# Patient Record
Sex: Male | Born: 1985 | Race: White | Hispanic: No | Marital: Single | Smoking: Never smoker
Health system: Southern US, Community
[De-identification: ages and names within clinical notes are randomized; demographics above are authoritative.]

## PROBLEM LIST (undated history)

## (undated) DIAGNOSIS — B1920 Unspecified viral hepatitis C without hepatic coma: Secondary | ICD-10-CM

## (undated) HISTORY — PX: APPENDECTOMY: SHX54

---

## 2007-10-21 ENCOUNTER — Other Ambulatory Visit: Payer: Self-pay

## 2007-10-21 ENCOUNTER — Emergency Department: Payer: Self-pay | Admitting: Emergency Medicine

## 2007-11-07 ENCOUNTER — Emergency Department: Payer: Self-pay | Admitting: Emergency Medicine

## 2007-11-11 ENCOUNTER — Emergency Department: Payer: Self-pay | Admitting: Emergency Medicine

## 2008-07-25 ENCOUNTER — Emergency Department: Payer: Self-pay | Admitting: Emergency Medicine

## 2008-11-04 ENCOUNTER — Emergency Department: Payer: Self-pay | Admitting: Emergency Medicine

## 2008-11-07 ENCOUNTER — Emergency Department (HOSPITAL_COMMUNITY): Admission: EM | Admit: 2008-11-07 | Discharge: 2008-11-07 | Payer: Self-pay | Admitting: Emergency Medicine

## 2009-08-18 ENCOUNTER — Emergency Department: Payer: Self-pay | Admitting: Emergency Medicine

## 2009-10-16 ENCOUNTER — Emergency Department: Payer: Self-pay | Admitting: Emergency Medicine

## 2010-05-08 IMAGING — CR DG CHEST 2V
1 series · 2 of 2 positions shown · non-contrast
Comparison: none

REASON FOR EXAM: cough , chills
COMMENTS:

PROCEDURE:     DXR - DXR CHEST PA (OR AP) AND LATERAL  - August 18, 2009  [DATE]
RESULT:     Comparison is made to study 04 November, 2008.
The lungs are adequately inflated. There is no focal infiltrate. The cardiac
silhouette is normal in size. The pulmonary vascularity is not engorged.

[Series 1: view not recorded · 0.17mm/px · 2 of 2 slices shown]
[im 1/2]
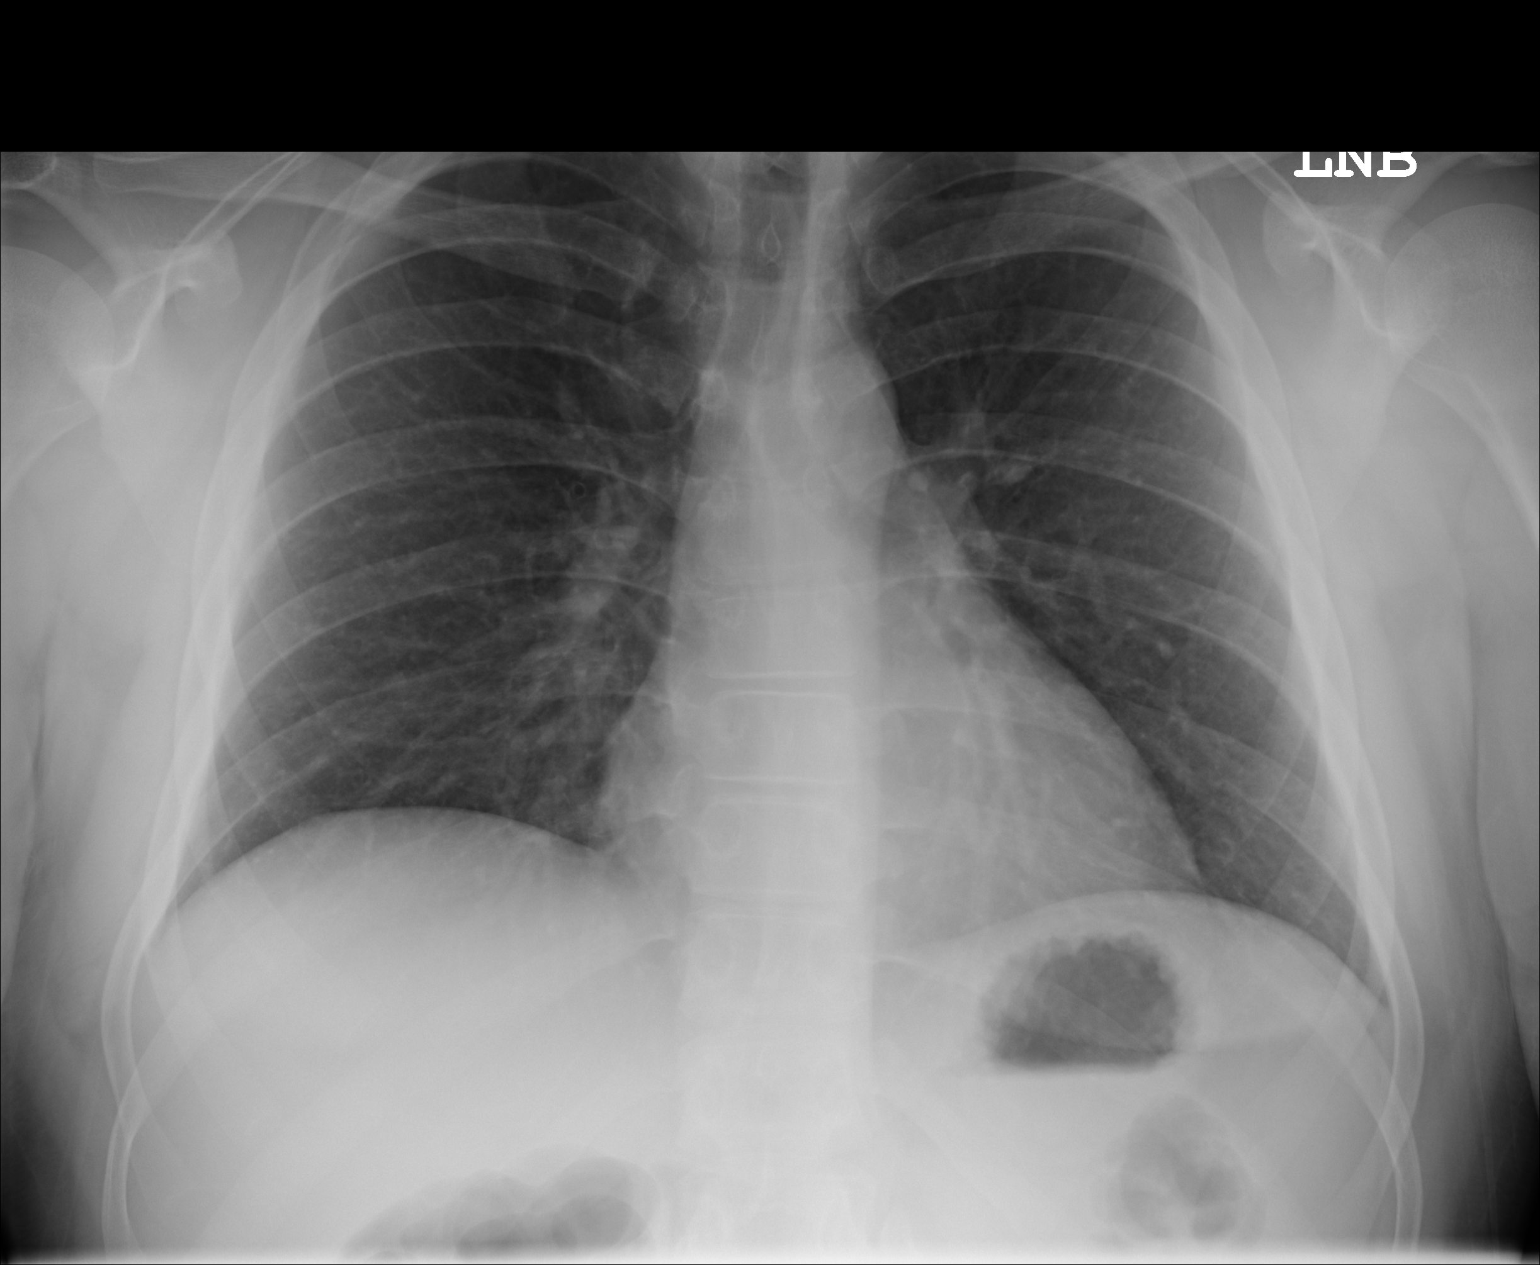
[im 2/2]
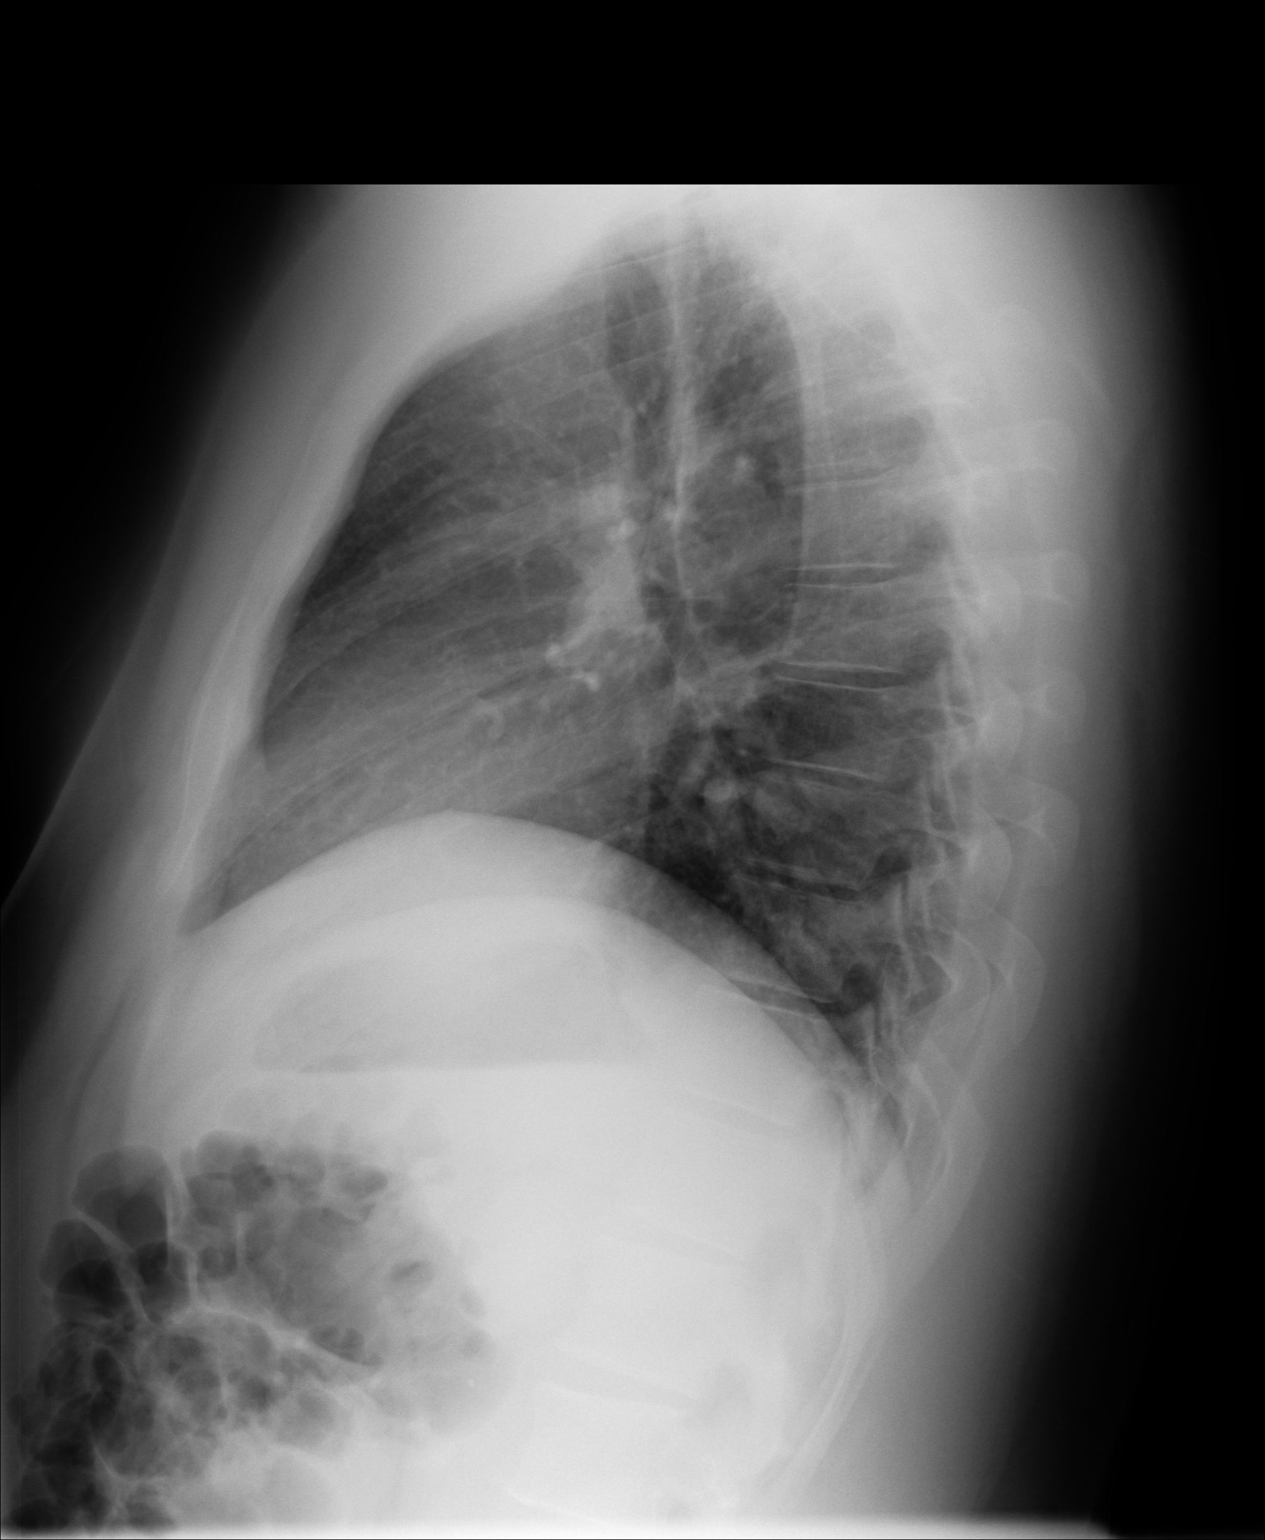

[2 of 2 positions shown; findings below may reference images not displayed]

IMPRESSION: I do do not see evidence of pneumonia nor definite evidence
of other acute cardiopulmonary abnormality.

## 2016-08-22 ENCOUNTER — Ambulatory Visit (INDEPENDENT_AMBULATORY_CARE_PROVIDER_SITE_OTHER): Payer: Self-pay | Admitting: Internal Medicine

## 2020-07-11 ENCOUNTER — Emergency Department: Payer: 59

## 2020-07-11 ENCOUNTER — Emergency Department
Admission: EM | Admit: 2020-07-11 | Discharge: 2020-07-11 | Disposition: A | Discharge status: Home / Self Care | Payer: 59 | Attending: Emergency Medicine | Admitting: Emergency Medicine

## 2020-07-11 DIAGNOSIS — M79662 Pain in left lower leg: Secondary | ICD-10-CM | POA: Insufficient documentation

## 2020-07-11 DIAGNOSIS — R079 Chest pain, unspecified: Secondary | ICD-10-CM | POA: Insufficient documentation

## 2020-07-11 HISTORY — DX: Unspecified viral hepatitis C without hepatic coma: B19.20

## 2020-07-11 LAB — CBC AND DIFFERENTIAL
Absolute NRBC: 0 10*3/uL (ref 0.00–0.00)
Basophils Absolute Automated: 0.06 10*3/uL (ref 0.00–0.08)
Basophils Automated: 1 %
Eosinophils Absolute Automated: 0.07 10*3/uL (ref 0.00–0.44)
Eosinophils Automated: 1.2 %
Hematocrit: 45.7 % (ref 37.6–49.6)
Hgb: 16.1 g/dL (ref 12.5–17.1)
Immature Granulocytes Absolute: 0.02 10*3/uL (ref 0.00–0.07)
Immature Granulocytes: 0.3 %
Lymphocytes Absolute Automated: 1.62 10*3/uL (ref 0.42–3.22)
Lymphocytes Automated: 27 %
MCH: 30 pg (ref 25.1–33.5)
MCHC: 35.2 g/dL (ref 31.5–35.8)
MCV: 85.1 fL (ref 78.0–96.0)
MPV: 9.8 fL (ref 8.9–12.5)
Monocytes Absolute Automated: 0.49 10*3/uL (ref 0.21–0.85)
Monocytes: 8.2 %
Neutrophils Absolute: 3.73 10*3/uL (ref 1.10–6.33)
Neutrophils: 62.3 %
Nucleated RBC: 0 /100 WBC (ref 0.0–0.0)
Platelets: 247 10*3/uL (ref 142–346)
RBC: 5.37 10*6/uL (ref 4.20–5.90)
RDW: 13 % (ref 11–15)
WBC: 5.99 10*3/uL (ref 3.10–9.50)

## 2020-07-11 LAB — BASIC METABOLIC PANEL
Anion Gap: 10 (ref 5.0–15.0)
BUN: 16 mg/dL (ref 9–28)
CO2: 24 mEq/L (ref 22–29)
Calcium: 10.2 mg/dL (ref 8.5–10.5)
Chloride: 103 mEq/L (ref 100–111)
Creatinine: 1 mg/dL (ref 0.7–1.3)
Glucose: 93 mg/dL (ref 70–100)
Potassium: 4.2 mEq/L (ref 3.5–5.1)
Sodium: 137 mEq/L (ref 136–145)

## 2020-07-11 LAB — GFR: EGFR: >60

## 2020-07-11 LAB — IHS D-DIMER: D-Dimer: <0.22 ug/mL FEU (ref 0.00–0.60)

## 2020-07-11 LAB — TROPONIN I: Troponin I: <0.01 ng/mL (ref 0.00–0.05)

## 2020-07-11 NOTE — Discharge Instructions (Signed)
Dear Garrett Barrett,    You were seen today by Deborah Chalk, PA-C. Thank you for choosing the [facility] for your healthcare needs.  We hope your visit today was EXCELLENT.    Follow up with your doctor this week for re-evaluation.  Return to the emergency department for any new or worsening symptoms.    If you have any questions or concerns, I am available at [phone]. Please do not hesitate to contact me if I can be of assistance.     Below is some information and resources that our patients often find helpful.    Sincerely,    Deborah Chalk, Physician Assistant  [facility] - Department of Emergency Medicine    ________________________________________________________________      Thank you for choosing [facility] for your emergency care needs.  We strive to provide EXCELLENT care to you and your family.      DOCTOR REFERRALS  Call [phone] if you need any further referrals and we can help you find a primary care doctor or specialist.  Also, available online at:  [email or web address]    YOUR CONTACT INFORMATION  Before leaving please check with registration to make sure we have an up-to-date contact number.  You can call registration at [phone] to update your information.  For questions about your hospital bill, please call [phone].  For questions about your Emergency Dept Physician bill please call [phone].      FREE HEALTH SERVICES  If you need help with health or social services, please call 2-1-1 for a free referral to resources in your area.  2-1-1 is a free service connecting people with information on health insurance, free clinics, pregnancy, mental health, dental care, food assistance, housing, and substance abuse counseling.  Also, available online at:  [email or web address].org    MEDICAL RECORDS AND TESTS  Certain laboratory test results do not come back the same day, for example urine  cultures.  We will contact you if other important findings are noted.  Radiology films are often reviewed again to ensure accuracy.  If there is any discrepancy, we will notify you.      Please call [phone] to pick up a complimentary CD of any radiology studies performed.  If you or your doctor would like to request a copy of your medical records, please call [phone].      ORTHOPEDIC INJURY   Please know that significant injuries can exist even when an initial x-ray is read as normal or negative.  This can occur because some fractures (broken bones) are not initially visible on x-rays.  For this reason, close outpatient follow-up with your primary care doctor or bone specialist (orthopedist) is required.    MEDICATIONS AND FOLLOWUP  Please be aware that some prescription medications can cause drowsiness.  Use caution when driving or operating machinery.    The examination and treatment you have received in our Emergency Department is provided on an emergency basis, and is not intended to be a substitute for your primary care physician.  It is important that your doctor checks you again and that you report any new or remaining problems at that time.      LOCAL PHARMACIES  [facility] - [address], [place], [place] [address] (1.4 miles, 7 minutes)  [facility] - [address] [place], [place] [address] (6.5 miles, 13 minutes)  Handout with directions available on request    PATIENT RELATIONS  If  you have any concerns, issues, or feedback related to your care, positive or negative, please do not hesitate to contact Patient Relations at [phone]. They are open from 8:30AM-5:00PM Monday through Friday.

## 2020-07-11 NOTE — ED Triage Notes (Signed)
Pt reports pain in L calf started on Thursday, feeling different. No redness or swelling noted. Reports Chest pain started today, with SOB, talking in full clear sentences. Bilateral pulses 3+. Is on estrogen medications.

## 2020-07-11 NOTE — ED Provider Notes (Signed)
[facility]       [facility]  EMERGENCY DEPARTMENT HISTORY AND PHYSICAL EXAM    Patient Information     Patient Name: Garrett Barrett, Garrett Barrett  Encounter Date:  07/11/2020  Patient DOB:  19-Apr-1986  MRN:  [id number]  Room:  TranOut/TO  Rendering Provider: Delice Bison A. Jeannetta Nap, PA-C, CAQ-EM    History of Presenting Illness     Chief Complaint: calf pain  Historian: pt  Onset: gradual, Thursday afternoon  Quality: muscle spasm  Location: left    Duration: x2 days      HPI Comments:   34 y.o. patient (transitioning M to F) h/o hep C c/o gradual onset of left calf pain that feels x2 days. Today around 1230pm pt states she started having 4/10 mid sternal chest pain and shortness of breath. No fever, cough, hemoptysis, abd pain, n/v, leg swelling. No radiation of the pain to the neck/jaw/arm. No recent travel or surgery. Non-smoker. Does take estrogen. No hx of DVT/PE. No other complaints.    PMD: Pcp, None, MD    Past Medical History     Past Medical History:   Diagnosis Date    Hepatitis C        Past Surgical History     Past Surgical History:   Procedure Laterality Date    APPENDECTOMY         Family History     History reviewed. No pertinent family history.    Social History     Social History     Socioeconomic History    Marital status: Single     Spouse name: Not on file    Number of children: Not on file    Years of education: Not on file    Highest education level: Not on file   Occupational History    Not on file   Tobacco Use    Smoking status: Never Smoker    Smokeless tobacco: Never Used   Vaping Use    Vaping Use: Never used   Substance and Sexual Activity    Alcohol use: Never    Drug use: Never    Sexual activity: Not on file   Other Topics Concern    Not on file   Social History Narrative    Not on file     Social Determinants of Health     Financial Resource Strain:     Difficulty of Paying Living Expenses: Not on file   Food Insecurity:     Worried About  Running Out of Food in the Last Year: Not on file    Ran Out of Food in the Last Year: Not on file   Transportation Needs:     Lack of Transportation (Medical): Not on file    Lack of Transportation (Non-Medical): Not on file   Physical Activity:     Days of Exercise per Week: Not on file    Minutes of Exercise per Session: Not on file   Stress:     Feeling of Stress : Not on file   Social Connections:     Frequency of Communication with Friends and Family: Not on file    Frequency of Social Gatherings with Friends and Family: Not on file    Attends Religious Services: Not on file    Active Member of Clubs or Organizations: Not on file    Attends Banker Meetings: Not on file    Marital Status: Not on file   Intimate  Partner Violence:     Fear of Current or Ex-Partner: Not on file    Emotionally Abused: Not on file    Physically Abused: Not on file    Sexually Abused: Not on file   Housing Stability:     Unable to Pay for Housing in the Last Year: Not on file    Number of Places Lived in the Last Year: Not on file    Unstable Housing in the Last Year: Not on file       Allergies     No Known Allergies    Home Medications     Prior to Admission medications    Medication Sig Start Date End Date Taking? Authorizing Provider   estradiol (VIVELLE-DOT) 0.05 MG/24HR  04/21/20   [provider]   spironolactone (ALDACTONE) 50 MG tablet  04/21/20   [provider]         Review of Systems     Review of Systems   Constitutional: Negative for fever.   Cardiovascular: Positive for chest pain.   Respiratory: Positive for shortness of breath. Negative for cough and hemoptysis.    Gastrointestinal: Negative for nausea, vomiting, and abd pain.  Musculoskeletal: Positive for left calf pain. No LE edema.    All other systems reviewed and are negative.      Physical Exam     Patient Vitals for the past 24 hrs:   BP Temp Temp src Pulse Resp SpO2 Height Weight   07/11/20 1635 127/80 99 F  (37.2 C) Oral 86 16 99 %     07/11/20 1627   Oral    6\' 1"  (1.854 m) 106.9 kg     Physical Exam   Nursing note and vitals reviewed.   Constitutional: Pt is oriented to person, place, and time. Pt appears well-developed and well-nourished. Comfortable. No distress.   HENT:     Head: Normocephalic and atraumatic.     Right Ear: External ear normal.     Left Ear: External ear normal.     Mouth/Throat: Normal speech. (Wearing a mask).   Eyes: Conjunctivae normal. No drainage. EOMI.  Neck: Normal range of motion.   Cardiovascular: Regular rate and rhythm. No murmur.    Pulmonary/Chest: Vesicular breath sounds. No respiratory distress. O2 sat 99% on RA.   Abdominal: Scar noted in RLQ which pt reports is from prior appendectomy. Bowel sounds are normal.   MSK: No LE edema. +Left calf TTP. 2+ DP/PT pulses BLE.  Neurological: Pt is alert and oriented to person, place, and time. GCS 15.  Skin: Skin is warm and dry. Normal skin turgor.   Psychiatric: Normal mood and affect. Behavior is normal.         Orders Placed During This Encounter     Orders Placed This Encounter   Procedures    Chest AP Portable    US Venous Dopp Left Low Extrem    Basic Metabolic Panel    CBC and differential    Troponin I    D-Dimer    GFR    Cardiac monitoring (ED ONLY)    ECG 12 Lead    ECG 12 lead    Saline lock IV       ED Medications Administered     ED Medication Orders (From admission, onward)    None          Diagnostic Study Results and Data Review     The results of the diagnostic studies below were  reviewed by the ED provider:    Labs  Results     Procedure Component Value Units Date/Time    Troponin I [[id number]] Collected: 07/11/20 1745    Specimen: Blood Updated: 07/11/20 1820     Troponin I <0.01 ng/mL     Basic Metabolic Panel [[id number]] Collected: 07/11/20 1745    Specimen: Blood Updated: 07/11/20 1812     Glucose 93 mg/dL      BUN 16 mg/dL      Creatinine 1.0 mg/dL      Calcium 21.3 mg/dL      Sodium 086 mEq/L       Potassium 4.2 mEq/L      Chloride 103 mEq/L      CO2 24 mEq/L      Anion Gap 10.0    GFR [[id number]] Collected: 07/11/20 1745     Updated: 07/11/20 1812     EGFR >60.0    D-Dimer [[id number]] Collected: 07/11/20 1745     Updated: 07/11/20 1811     D-Dimer <0.22 ug/mL FEU     CBC and differential [[id number]] Collected: 07/11/20 1745    Specimen: Blood Updated: 07/11/20 1754     WBC 5.99 x10 3/uL      Hgb 16.1 g/dL      Hematocrit 64.4 %      Platelets 247 x10 3/uL      RBC 5.37 x10 6/uL      MCV 85.1 fL      MCH 30.0 pg      MCHC 35.2 g/dL      RDW 13 %      MPV 9.8 fL      Neutrophils 62.3 %      Lymphocytes Automated 27.0 %      Monocytes 8.2 %      Eosinophils Automated 1.2 %      Basophils Automated 1.0 %      Immature Granulocytes 0.3 %      Nucleated RBC 0.0 /100 WBC      Neutrophils Absolute 3.73 x10 3/uL      Lymphocytes Absolute Automated 1.62 x10 3/uL      Monocytes Absolute Automated 0.49 x10 3/uL      Eosinophils Absolute Automated 0.07 x10 3/uL      Basophils Absolute Automated 0.06 x10 3/uL      Immature Granulocytes Absolute 0.02 x10 3/uL      Absolute NRBC 0.00 x10 3/uL           Radiologic Studies  Radiology Results (24 Hour)     Procedure Component Value Units Date/Time    US Venous Dopp Left Low Extrem [[id number]] Collected: 07/11/20 1850    Order Status: Completed Updated: 07/11/20 1853    Narrative:      HISTORY:  Left leg pain    COMPARISON:  None    FINDINGS: Duplex evaluation of the deep venous system of the left lower  extremity was performed.   The common femoral vein, femoral vein and  popliteal vein were visualized and appear unremarkable.  These veins are  compressible with no evidence of thrombus.  Doppler demonstrates patency  with normal directional and phasic flow.  Appropriate augmentation was  observed.  The proximal deep femoral vein is visualized and appears  clear.  The portions of the posterior tibial and peroneal veins that  were seen compress normally with no thrombus noted.         Impression:       No evidence of  deep venous thrombosis involving the left  lower extremity.    Gerlene Burdock, MD   07/11/2020 6:51 PM    Chest AP Portable [[id number]] Collected: 07/11/20 1822    Order Status: Completed Updated: 07/11/20 1832    Narrative:      CLINICAL HISTORY: Chest pain    COMPARISON: None    TECHNIQUE: Single portable semiupright AP chest    FINDINGS:    The lungs are clear. The heart size is normal. Mediastinal contours are  unremarkable.      Impression:        1. No acute cardiopulmonary process.    Launa Flight, MD   07/11/2020 6:30 PM            Abnormal results/incidental findings discussed with pt and/or family: n/a     Monitors, EKG, Procedures, Critical Care, and Splints     Cardiac Monitor Interpretation: Normal sinus rhythm at 76 bpm. Normal axis. Non-specific T wave inversions in lead III. No prior EKG for comparison. EKG reviewed w/ MD Arvilla Market.   EKG: Sinus rhythm in the low 80's. No ectopy.     MDM and Clinical Notes     Working Differential (not completely inclusive): DVT/PE, muscle spasm, ACS/MI, arrhythmia, pericarditis, etc    Nursing records reviewed and agree: Yes     Clinical Notes: History as above. Physical exam unremarkable aside from some mild L calf TTP. Will plan to check basic labs, cardiac marker, ddimer, ekg and cxr. Pt in agreement.    White count normal  H/H wnl  Platelets normal  Cr wnl  Trop neg x1  Ddimer neg     CXR wnl  Doppler neg for DVT    Re-Eval: Re-eval prior to discharge - Pt comfortable. No distress. VSS.    All results r/w pt. Stable for discharge. Workup overall reassuring. No evidence of DVT/PE. Encouraged f/u with PMD. If symptoms persist, could consider repeat doppler after a week or so. Pt in agreement. Questions answered. CP precautions reviewed as well as reasons for return.    Personal Protective Equipment:  I was wearing appropriate personal protective equipment at the time of patient evaluation and for all face-to-face interactions:      Pandemic Caveat:  The patient was seen and evaluated during the time of COVID pandemic.  Significant limitations can be present in the evaluation and management of emergency department patients during pandemic conditions, including but not limited to lack of testing, rapidly changing IHS protocols, and/or limited follow-up resources.         Prescriptions       Discharge Medication List as of 07/11/2020  7:18 PM          Diagnosis and Disposition     Clinical Impression:  1. Chest pain, unspecified type    2. Pain of left calf      Final diagnoses:   Chest pain, unspecified type   Pain of left calf       Disposition:  ED Disposition     ED Disposition Condition Date/Time Comment    Discharge  Sat Jul 11, 2020  7:35 PM [place] discharge to home/self care.    Condition at disposition: Stable                Rendering Provider: Cyndy Freeze. Jeannetta Nap, PA-C, CAQ-EM    Attending's signature signifies review of the provider note and clinical impression.      This note was generated by the [facility] EMR system/Dragon speech  recognition and may contain inherent errors or omissions not intended by the user. Grammatical errors, random word insertions, deletions, pronoun errors and incomplete sentences are occasional consequences of this technology due to software limitations. Not all errors are caught or corrected. If there are questions or concerns about the content of this note or information contained within the body of this dictation they should be addressed directly with the author for clarification.           Nigel Sloop, [place]  07/13/20 2321       Elray Mcgregor, MD  07/15/20 1425

## 2020-07-12 LAB — ECG 12-LEAD
Atrial Rate: 76 {beats}/min
P Axis: 29 degrees
P-R Interval: 150 ms
Q-T Interval: 392 ms
QRS Duration: 98 ms
QTC Calculation (Bezet): 441 ms
R Axis: 51 degrees
T Axis: -2 degrees
Ventricular Rate: 76 {beats}/min

## 2022-02-24 ENCOUNTER — Encounter (HOSPITAL_COMMUNITY): Payer: Self-pay

## 2022-02-24 ENCOUNTER — Emergency Department (HOSPITAL_COMMUNITY)
Admission: EM | Admit: 2022-02-24 | Discharge: 2022-02-24 | Disposition: A | Discharge status: Home / Self Care | Payer: Medicaid Other | Attending: Emergency Medicine | Admitting: Emergency Medicine

## 2022-02-24 DIAGNOSIS — H6690 Otitis media, unspecified, unspecified ear: Secondary | ICD-10-CM

## 2022-02-24 DIAGNOSIS — H9201 Otalgia, right ear: Secondary | ICD-10-CM | POA: Diagnosis present

## 2022-02-24 DIAGNOSIS — H6691 Otitis media, unspecified, right ear: Secondary | ICD-10-CM | POA: Insufficient documentation

## 2022-02-24 HISTORY — DX: Unspecified viral hepatitis C without hepatic coma: B19.20

## 2022-02-24 MED ORDER — AMOXICILLIN 500 MG PO CAPS
500.0000 mg | ORAL_CAPSULE | Freq: Three times a day (TID) | ORAL | 0 refills | Status: DC
Start: 2022-02-24 — End: 2022-02-24

## 2022-02-24 MED ORDER — AMOXICILLIN 500 MG PO CAPS
500.0000 mg | ORAL_CAPSULE | Freq: Three times a day (TID) | ORAL | 0 refills | Status: DC
Start: 2022-02-24 — End: 2022-08-02

## 2022-02-24 NOTE — ED Triage Notes (Signed)
Pt states that they have been having R ear pain, jaw pain and R sided neck pain for the past week. Denies ear drainage or dental problems

## 2022-02-24 NOTE — ED Provider Notes (Signed)
[facility] EMERGENCY DEPARTMENT Provider Note   CSN: [id number] Arrival date & time: 02/24/22  1922     History  Chief Complaint  Patient presents with   Ear Pain    Carlos Washington is a 36 y.o. male.  Pt complains of right ear pain and neck sorenss for 1 week.   The history is provided by the patient. No language interpreter was used.  Otalgia Location:  Right Behind ear:  No abnormality Quality:  Aching Severity:  Moderate Onset quality:  Gradual Duration:  1 week Timing:  Constant Progression:  Worsening Chronicity:  New Relieved by:  None tried Worsened by:  Nothing Ineffective treatments:  None tried Associated symptoms: no vomiting        Home Medications Prior to Admission medications   Medication Sig Start Date End Date Taking? Authorizing Provider  amoxicillin (AMOXIL) 500 MG capsule Take 1 capsule (500 mg total) by mouth 3 (three) times daily. 02/24/22  Yes Elson Areas, PA-C      Allergies    Patient has no known allergies.    Review of Systems   Review of Systems  HENT:  Positive for ear pain.   Gastrointestinal:  Negative for vomiting.  All other systems reviewed and are negative.   Physical Exam Updated Vital Signs BP (!) 144/86   Pulse 73   Temp 98.4 F (36.9 C) (Oral)   Resp 18   SpO2 100%  Physical Exam Vitals and nursing note reviewed.  Constitutional:      General: He is not in acute distress.    Appearance: He is well-developed.  HENT:     Head: Normocephalic and atraumatic.     Ears:     Comments: Right tm erythematous and bulging    Nose: Nose normal.  Eyes:     Conjunctiva/sclera: Conjunctivae normal.  Cardiovascular:     Rate and Rhythm: Normal rate and regular rhythm.     Heart sounds: No murmur heard. Pulmonary:     Effort: Pulmonary effort is normal. No respiratory distress.     Breath sounds: Normal breath sounds.  Abdominal:     Palpations: Abdomen is soft.     Tenderness: There is no  abdominal tenderness.  Musculoskeletal:        General: No swelling.     Cervical back: Neck supple.  Skin:    General: Skin is warm and dry.     Capillary Refill: Capillary refill takes less than 2 seconds.  Neurological:     Mental Status: He is alert.  Psychiatric:        Mood and Affect: Mood normal.     ED Results / Procedures / Treatments   Labs (all labs ordered are listed, but only abnormal results are displayed) Labs Reviewed - No data to display  EKG None  Radiology No results found.  Procedures Procedures    Medications Ordered in ED Medications - No data to display  ED Course/ Medical Decision Making/ A&P                           Medical Decision Making Risk Prescription drug management.           Final Clinical Impression(s) / ED Diagnoses Final diagnoses:  Acute otitis media, unspecified otitis media type    Rx / DC Orders ED Discharge Orders          Ordered    amoxicillin (AMOXIL)  500 MG capsule  3 times daily        02/24/22 1944           An After Visit Summary was printed and given to the patient.    Elson Areas, Cordelia Poche 02/24/22 1950    Maia Plan, MD 02/24/22 [phone]

## 2022-02-24 NOTE — Discharge Instructions (Addendum)
Ibuprofen for discomfort.  Return if any problems.  

## 2022-08-02 ENCOUNTER — Ambulatory Visit (HOSPITAL_COMMUNITY)
Admission: EM | Admit: 2022-08-02 | Discharge: 2022-08-02 | Disposition: A | Discharge status: Home / Self Care | Payer: Self-pay | Attending: Emergency Medicine | Admitting: Emergency Medicine

## 2022-08-02 ENCOUNTER — Encounter (HOSPITAL_COMMUNITY): Payer: Self-pay | Admitting: Emergency Medicine

## 2022-08-02 DIAGNOSIS — H65191 Other acute nonsuppurative otitis media, right ear: Secondary | ICD-10-CM

## 2022-08-02 MED ORDER — AMOXICILLIN 500 MG PO CAPS
500.0000 mg | ORAL_CAPSULE | Freq: Two times a day (BID) | ORAL | 0 refills | Status: AC
Start: 2022-08-02 — End: 2022-08-07

## 2022-08-02 NOTE — Discharge Instructions (Signed)
Please take medication as prescribed. Take with food to avoid upset stomach.  Drink lots of fluids Ibuprofen up to 800 mg every 6 hours as needed

## 2022-08-02 NOTE — ED Triage Notes (Signed)
Pt reports right ear pain and right side jaw pain x 10 days, states symptoms have gotten worse. Also reports body aches, mild nausea after eating, and chills since Saturday. States nausea normally resolves after a couple hours.   Has tried mouth wash, ice, heat, Ibuprofen with little relief.

## 2022-08-02 NOTE — ED Provider Notes (Signed)
[facility]    CSN: [id number] Arrival date & time: 08/02/22  0998      History   Chief Complaint Chief Complaint  Patient presents with   Nausea   Generalized Body Aches   Chills   Ear Pain   Jaw Pain    HPI Carlos Washington is a 36 y.o. adult.  Presents with 10 day history of right ear pain Radiates to right jaw Today 4/10 pain  Body aches, nausea, chills x 3 days No fevers. No vomiting  Tried ibuprofen without relief, ice and heat Denies sick contacts  Past Medical History:  Diagnosis Date   Hepatitis C     There are no problems to display for this patient.   Past Surgical History:  Procedure Laterality Date   APPENDECTOMY         Home Medications    Prior to Admission medications   Medication Sig Start Date End Date Taking? Authorizing Provider  amoxicillin (AMOXIL) 500 MG capsule Take 1 capsule (500 mg total) by mouth 2 (two) times daily for 5 days. 08/02/22 08/07/22 Yes Lakeasha Petion, Ray Church    Family History History reviewed. No pertinent family history.  Social History Social History   Tobacco Use   Smoking status: Never   Smokeless tobacco: Never     Allergies   Patient has no known allergies.   Review of Systems Review of Systems Per HPI  Physical Exam Triage Vital Signs ED Triage Vitals  Enc Vitals Group     BP 08/02/22 0835 116/79     Pulse Rate 08/02/22 0835 73     Resp 08/02/22 0835 16     Temp 08/02/22 0835 98.2 F (36.8 C)     Temp Source 08/02/22 0835 Oral     SpO2 08/02/22 0835 96 %     Weight 08/02/22 0834 225 lb (102.1 kg)     Height 08/02/22 0833 6' (1.829 m)     Head Circumference --      Peak Flow --      Pain Score 08/02/22 0832 4     Pain Loc --      Pain Edu? --      Excl. in [place]? --    No data found.  Updated Vital Signs BP 116/79 (BP Location: Right Arm)   Pulse 73   Temp 98.2 F (36.8 C) (Oral)   Resp 16   Ht 6' (1.829 m)   Wt 225 lb (102.1 kg)   SpO2 96%   BMI 30.52  kg/m    Physical Exam Vitals and nursing note reviewed.  Constitutional:      General: She is not in acute distress.    Appearance: Normal appearance. She is not ill-appearing.  HENT:     Head:     Jaw: There is normal jaw occlusion. No tenderness or pain on movement.     Right Ear: Tympanic membrane is injected and erythematous.     Left Ear: Tympanic membrane and ear canal normal.     Nose: No congestion.     Mouth/Throat:     Mouth: Mucous membranes are moist.     Dentition: Normal dentition. No dental tenderness or gingival swelling.     Pharynx: Oropharynx is clear. Uvula midline. No posterior oropharyngeal erythema.  Eyes:     Conjunctiva/sclera: Conjunctivae normal.  Cardiovascular:     Rate and Rhythm: Normal rate and regular rhythm.     Pulses: Normal pulses.  Pulmonary:  Effort: Pulmonary effort is normal.  Abdominal:     Tenderness: There is no abdominal tenderness.  Lymphadenopathy:     Cervical: No cervical adenopathy.  Skin:    General: Skin is warm and dry.  Neurological:     Mental Status: She is alert and oriented to person, place, and time.      UC Treatments / Results  Labs (all labs ordered are listed, but only abnormal results are displayed) Labs Reviewed - No data to display  EKG   Radiology No results found.  Procedures Procedures (including critical care time)  Medications Ordered in UC Medications - No data to display  Initial Impression / Assessment and Plan / UC Course  I have reviewed the triage vital signs and the nursing notes.  Pertinent labs & imaging results that were available during my care of the patient were reviewed by me and considered in my medical decision making (see chart for details).  Well appearing Ear pain is likely cause of jaw pain, no sign of dental abnormality, no temporal pain or headache AOM right ear, non severe Amox BID x 5 days Discussed possible viral etiology of other symptoms, patient  declines viral testing today Ibuprofen for pain/body aches Return precautions discussed. Patient agrees to plan  Final Clinical Impressions(s) / UC Diagnoses   Final diagnoses:  Other acute nonsuppurative otitis media of right ear, recurrence not specified     Discharge Instructions      Please take medication as prescribed. Take with food to avoid upset stomach.  Drink lots of fluids Ibuprofen up to 800 mg every 6 hours as needed     ED Prescriptions     Medication Sig Dispense Auth. Provider   amoxicillin (AMOXIL) 500 MG capsule Take 1 capsule (500 mg total) by mouth 2 (two) times daily for 5 days. 10 capsule Carlos Washington, Wells Guiles, PA-C      PDMP not reviewed this encounter.   Aahan Marques, Wells Guiles, [place] 08/02/22 [id number]

## 2022-09-27 ENCOUNTER — Ambulatory Visit (HOSPITAL_COMMUNITY)
Admission: RE | Admit: 2022-09-27 | Discharge: 2022-09-27 | Disposition: A | Discharge status: Home / Self Care | Payer: Self-pay | Source: Ambulatory Visit | Attending: Family Medicine | Admitting: Family Medicine

## 2022-09-27 ENCOUNTER — Encounter (HOSPITAL_COMMUNITY): Payer: Self-pay

## 2022-09-27 VITALS — BP 137/90 | HR 74 | Temp 100.1°F | Resp 18

## 2022-09-27 DIAGNOSIS — J029 Acute pharyngitis, unspecified: Secondary | ICD-10-CM

## 2022-09-27 DIAGNOSIS — R070 Pain in throat: Secondary | ICD-10-CM | POA: Insufficient documentation

## 2022-09-27 LAB — POCT RAPID STREP A, ED / UC: Streptococcus, Group A Screen (Direct): NEGATIVE

## 2022-09-27 MED ORDER — KETOROLAC TROMETHAMINE 30 MG/ML IJ SOLN
30.0000 mg | Freq: Once | INTRAMUSCULAR | Status: AC
  Administered 2022-09-27: 30 mg via INTRAMUSCULAR

## 2022-09-27 MED ORDER — AMOXICILLIN 875 MG PO TABS
875.0000 mg | ORAL_TABLET | Freq: Two times a day (BID) | ORAL | 0 refills | Status: AC
Start: 2022-09-27 — End: 2022-10-07

## 2022-09-27 MED ORDER — KETOROLAC TROMETHAMINE 30 MG/ML IJ SOLN
INTRAMUSCULAR | Status: AC
  Filled 2022-09-27: qty 1

## 2022-09-27 NOTE — ED Provider Notes (Signed)
[name]    CSN: [id number] Arrival date & time: 09/27/22  0813      History   Chief Complaint Chief Complaint  Patient presents with   Dental Pain    HPI Carlos Washington is a 37 y.o. adult.    Dental Pain  Here for 2 day h/o right sided ear pain, throat pain and jaw pain. It is hurting to swallow. No f/c/n/v/d/cough/congestion noted at home. Here temp is 100.1   Past Medical History:  Diagnosis Date   Hepatitis C     There are no problems to display for this patient.   Past Surgical History:  Procedure Laterality Date   APPENDECTOMY         Home Medications    Prior to Admission medications   Medication Sig Start Date End Date Taking? Authorizing Provider  amoxicillin (AMOXIL) 875 MG tablet Take 1 tablet (875 mg total) by mouth 2 (two) times daily for 10 days. 09/27/22 10/07/22 Yes Barrett Henle, MD    Family History Family History  Problem Relation Age of Onset   Heart disease Mother     Social History Social History   Tobacco Use   Smoking status: Never   Smokeless tobacco: Never  Substance Use Topics   Alcohol use: Not Currently   Drug use: Yes    Types: Marijuana     Allergies   Patient has no known allergies.   Review of Systems Review of Systems   Physical Exam Triage Vital Signs ED Triage Vitals  Enc Vitals Group     BP 09/27/22 0825 (!) 137/90     Pulse Rate 09/27/22 0825 74     Resp 09/27/22 0825 18     Temp 09/27/22 0825 100.1 F (37.8 C)     Temp Source 09/27/22 0825 Oral     SpO2 09/27/22 0825 96 %     Weight --      Height --      Head Circumference --      Peak Flow --      Pain Score 09/27/22 0826 4     Pain Loc --      Pain Edu? --      Excl. in [place]? --    No data found.  Updated Vital Signs BP (!) 137/90 (BP Location: Left Arm)   Pulse 74   Temp 100.1 F (37.8 C) (Oral)   Resp 18   SpO2 96%   Visual Acuity Right Eye Distance:   Left Eye Distance:   Bilateral Distance:     Right Eye Near:   Left Eye Near:    Bilateral Near:     Physical Exam Vitals reviewed.  Constitutional:      General: She is not in acute distress.    Appearance: She is not ill-appearing, toxic-appearing or diaphoretic.     Comments: Voice is normal, not hot potato  HENT:     Ears:     Comments: Both TM's are gray and dull/opaque. No erythema and no dc in canal    Nose: Nose normal.     Mouth/Throat:     Mouth: Mucous membranes are moist.     Comments: He has a hard time opening his mouth for me to see OP. I can see some erythema of the soft palate. No asymmetry discerned. Eyes:     Extraocular Movements: Extraocular movements intact.     Conjunctiva/sclera: Conjunctivae normal.     Pupils: Pupils are equal,  round, and reactive to light.  Neck:     Comments: There is tender cervical adenopathy bilaterally Cardiovascular:     Rate and Rhythm: Normal rate and regular rhythm.     Heart sounds: No murmur heard. Pulmonary:     Effort: Pulmonary effort is normal. No respiratory distress.     Breath sounds: No stridor. No wheezing, rhonchi or rales.  Musculoskeletal:     Cervical back: Neck supple. No tenderness.  Skin:    Capillary Refill: Capillary refill takes less than 2 seconds.     Coloration: Skin is not jaundiced or pale.  Neurological:     General: No focal deficit present.     Mental Status: She is alert and oriented to person, place, and time.  Psychiatric:        Behavior: Behavior normal.      UC Treatments / Results  Labs (all labs ordered are listed, but only abnormal results are displayed) Labs Reviewed  CULTURE, GROUP A STREP [facility])  POCT RAPID STREP A, ED / UC    EKG   Radiology No results found.  Procedures Procedures (including critical care time)  Medications Ordered in UC Medications  ketorolac (TORADOL) 30 MG/ML injection 30 mg (has no administration in time range)    Initial Impression / Assessment and Plan / UC Course  I have  reviewed the triage vital signs and the nursing notes.  Pertinent labs & imaging results that were available during my care of the patient were reviewed by me and considered in my medical decision making (see chart for details).        Rapid strep is negative; throat culture sent. I am still going to treat with amoxicillin; I am concerned he has pharyngitis with his being unable to open his mouth completely. C/S sent still so that if he worsens, those results would be available. If worsening, not able to take po, to the ER Final Clinical Impressions(s) / UC Diagnoses   Final diagnoses:  Acute pharyngitis, unspecified etiology  Throat pain     Discharge Instructions      Your strep test is negative.  Culture of the throat will be sent, and staff will notify you if that is in turn positive.  Take amoxicillin 875 mg--1 tab twice daily for 10 days  You have been given a shot of Toradol 30 mg today.  Ibuprofen 200 mg over-the-counter--you can take 4 together orally every 8 hours as needed for pain  If you worsen with more pain or swelling in your throat or jaw or you cannot take an oral fluids, please present to the emergency room for further evaluation      ED Prescriptions     Medication Sig Dispense Auth. Provider   amoxicillin (AMOXIL) 875 MG tablet Take 1 tablet (875 mg total) by mouth 2 (two) times daily for 10 days. 20 tablet Manson Luckadoo, Gwenlyn Perking, MD      PDMP not reviewed this encounter.   Barrett Henle, MD 09/27/22 [phone]

## 2022-09-27 NOTE — ED Triage Notes (Signed)
Pt c/o rt lower tooth pain since Saturday. States now having rt ear/jaw/throat pain and difficulty eating. States took ibuprofen 600mg  last night with some relief.

## 2022-09-27 NOTE — Discharge Instructions (Signed)
Your strep test is negative.  Culture of the throat will be sent, and staff will notify you if that is in turn positive.  Take amoxicillin 875 mg--1 tab twice daily for 10 days  You have been given a shot of Toradol 30 mg today.  Ibuprofen 200 mg over-the-counter--you can take 4 together orally every 8 hours as needed for pain  If you worsen with more pain or swelling in your throat or jaw or you cannot take an oral fluids, please present to the emergency room for further evaluation

## 2022-09-30 LAB — CULTURE, GROUP A STREP (THRC)

## 2023-06-06 ENCOUNTER — Telehealth (HOSPITAL_COMMUNITY): Payer: Self-pay | Admitting: *Deleted

## 2023-06-06 ENCOUNTER — Ambulatory Visit (HOSPITAL_COMMUNITY)
Admission: EM | Admit: 2023-06-06 | Discharge: 2023-06-06 | Disposition: A | Discharge status: Home / Self Care | Payer: Self-pay | Attending: Family Medicine | Admitting: Family Medicine

## 2023-06-06 ENCOUNTER — Encounter (HOSPITAL_COMMUNITY): Payer: Self-pay | Admitting: *Deleted

## 2023-06-06 DIAGNOSIS — H6691 Otitis media, unspecified, right ear: Secondary | ICD-10-CM

## 2023-06-06 MED ORDER — AMOXICILLIN-POT CLAVULANATE 875-125 MG PO TABS: 1.0000 | ORAL_TABLET | Freq: Two times a day (BID) | ORAL | 0 refills | Status: AC

## 2023-06-06 MED ORDER — AMOXICILLIN-POT CLAVULANATE 875-125 MG PO TABS: 1.0000 | ORAL_TABLET | Freq: Two times a day (BID) | ORAL | 0 refills | Status: DC

## 2023-06-06 NOTE — ED Provider Notes (Signed)
[facility]    CSN: [id number] Arrival date & time: 06/06/23  1513      History   Chief Complaint Chief Complaint  Patient presents with   Dental Pain   Otalgia   Dizziness   Nausea    HPI COVEY BLAZ is a 37 y.o. adult.    Dental Pain Otalgia Dizziness Here with pain in his right ear and right posterior jaw area.  It does hurt to some but not really inside his mouth.  No fever or chills, and no cough or congestion.  He had nausea this morning briefly and he was dizzy some this morning.  No nausea or vomiting now.  He is not allergic to any medications  Ibuprofen 800 mg has worked well for the pain, and he states he does not need anymore at home  Past Medical History:  Diagnosis Date   Hepatitis C     There are no problems to display for this patient.   Past Surgical History:  Procedure Laterality Date   APPENDECTOMY         Home Medications    Prior to Admission medications   Medication Sig Start Date End Date Taking? Authorizing Provider  amoxicillin-clavulanate (AUGMENTIN) 875-125 MG tablet Take 1 tablet by mouth 2 (two) times daily for 7 days. 06/06/23 06/13/23 Yes Zenia Resides, MD    Family History Family History  Problem Relation Age of Onset   Heart disease Mother     Social History Social History   Tobacco Use   Smoking status: Never   Smokeless tobacco: Never  Vaping Use   Vaping status: Never Used  Substance Use Topics   Alcohol use: Not Currently   Drug use: Yes    Types: Marijuana     Allergies   Patient has no known allergies.   Review of Systems Review of Systems  HENT:  Positive for ear pain.   Neurological:  Positive for dizziness.     Physical Exam Triage Vital Signs ED Triage Vitals  Encounter Vitals Group     BP 06/06/23 1604 (!) 164/79     Systolic BP Percentile --      Diastolic BP Percentile --      Pulse Rate 06/06/23 1604 79     Resp 06/06/23 1604 18     Temp 06/06/23 1604  98.8 F (37.1 C)     Temp Source 06/06/23 1604 Oral     SpO2 06/06/23 1604 95 %     Weight --      Height --      Head Circumference --      Peak Flow --      Pain Score 06/06/23 1602 6     Pain Loc --      Pain Education --      Exclude from Growth Chart --    No data found.  Updated Vital Signs BP (!) 164/79 (BP Location: Right Arm)   Pulse 79   Temp 98.8 F (37.1 C) (Oral)   Resp 18   SpO2 95%   Visual Acuity Right Eye Distance:   Left Eye Distance:   Bilateral Distance:    Right Eye Near:   Left Eye Near:    Bilateral Near:     Physical Exam Vitals reviewed.  Constitutional:      General: She is not in acute distress.    Appearance: She is not toxic-appearing.  HENT:     Left Ear: Tympanic membrane  and ear canal normal.     Ears:     Comments: The right tympanic membrane is dull with some erythema superiorly.  The canal is normal and there is no pain on traction of the pinna    Nose: Nose normal.     Mouth/Throat:     Mouth: Mucous membranes are moist.     Pharynx: No oropharyngeal exudate or posterior oropharyngeal erythema.  Eyes:     Extraocular Movements: Extraocular movements intact.     Conjunctiva/sclera: Conjunctivae normal.     Pupils: Pupils are equal, round, and reactive to light.  Cardiovascular:     Rate and Rhythm: Normal rate and regular rhythm.     Heart sounds: No murmur heard. Pulmonary:     Effort: Pulmonary effort is normal. No respiratory distress.     Breath sounds: No stridor. No wheezing, rhonchi or rales.  Musculoskeletal:     Cervical back: Neck supple.  Lymphadenopathy:     Cervical: No cervical adenopathy.  Skin:    Capillary Refill: Capillary refill takes less than 2 seconds.     Coloration: Skin is not jaundiced or pale.  Neurological:     General: No focal deficit present.     Mental Status: She is alert and oriented to person, place, and time.  Psychiatric:        Behavior: Behavior normal.      UC Treatments /  Results  Labs (all labs ordered are listed, but only abnormal results are displayed) Labs Reviewed - No data to display  EKG   Radiology No results found.  Procedures Procedures (including critical care time)  Medications Ordered in UC Medications - No data to display  Initial Impression / Assessment and Plan / UC Course  I have reviewed the triage vital signs and the nursing notes.  Pertinent labs & imaging results that were available during my care of the patient were reviewed by me and considered in my medical decision making (see chart for details).     Augmentin is sent in for otitis media and possible dental infection.  He already has sufficient ibuprofen at home for pain. Final Clinical Impressions(s) / UC Diagnoses   Final diagnoses:  Right otitis media, unspecified otitis media type     Discharge Instructions      Take amoxicillin-clavulanate 875 mg--1 tab twice daily with food for 7 days      ED Prescriptions     Medication Sig Dispense Auth. Provider   amoxicillin-clavulanate (AUGMENTIN) 875-125 MG tablet Take 1 tablet by mouth 2 (two) times daily for 7 days. 14 tablet Rayann Jolley, Janace Aris, MD      PDMP not reviewed this encounter.   Zenia Resides, MD 06/06/23 [phone]

## 2023-06-06 NOTE — Telephone Encounter (Signed)
Rx changed to [facility] attempted to contact pt no answer

## 2023-06-06 NOTE — Discharge Instructions (Signed)
Take amoxicillin-clavulanate 875 mg--1 tab twice daily with food for 7 days

## 2023-06-06 NOTE — ED Triage Notes (Signed)
Pt states that she has right ear pain and right bottom dental pain since Thursday but started to get worse on Sunday.   Pt states that she is dizzy and having some nausea which started this morning.   She is taking IBU 800mg , last dose was today.

## 2023-08-13 ENCOUNTER — Emergency Department (HOSPITAL_COMMUNITY): Payer: Self-pay

## 2023-08-13 ENCOUNTER — Other Ambulatory Visit: Payer: Self-pay

## 2023-08-13 ENCOUNTER — Encounter (HOSPITAL_COMMUNITY): Payer: Self-pay | Admitting: *Deleted

## 2023-08-13 ENCOUNTER — Emergency Department (HOSPITAL_COMMUNITY)
Admission: EM | Admit: 2023-08-13 | Discharge: 2023-08-14 | Disposition: A | Discharge status: Home / Self Care | Payer: Self-pay | Attending: Emergency Medicine | Admitting: Emergency Medicine

## 2023-08-13 DIAGNOSIS — Y9351 Activity, roller skating (inline) and skateboarding: Secondary | ICD-10-CM | POA: Insufficient documentation

## 2023-08-13 DIAGNOSIS — S93401A Sprain of unspecified ligament of right ankle, initial encounter: Secondary | ICD-10-CM | POA: Insufficient documentation

## 2023-08-13 NOTE — ED Notes (Signed)
Pt going to bed from X-ray.

## 2023-08-13 NOTE — ED Triage Notes (Signed)
The pt reports that the pt injured the rt ankle 1-2 weeks ago  earlier today the pt fell and a pop was heard  painful since then

## 2023-08-14 NOTE — Discharge Instructions (Signed)
Tylenol or motrin as needed for pain.  Would also recommend to ice/elevate to keep swelling down. Follow-up with Dr. Christell Constant if ongoing issues-- call for appt. Return to the ED for new or worsening symptoms.

## 2023-08-14 NOTE — ED Notes (Signed)
Pt in NAD at d/c from ED. A&O. Ambulatory with crutches. Respirations even & unlabored. Skin warm & Dry. Pt verbalized understanding of d/c teaching including follow up care, pain management and reasons to return to the ED. No needs or questions expressed at d/c.

## 2023-08-14 NOTE — ED Provider Notes (Signed)
[place] EMERGENCY DEPARTMENT AT [facility] Provider Note   CSN: [id number] Arrival date & time: 08/13/23  2304     History  Chief Complaint  Patient presents with   Ankle Pain    Carlos Washington is a 37 y.o. adult.  The history is provided by the patient and medical records.  Ankle Pain  37 y.o. M (transgender male), presenting to the ED for right ankle injury.  States initially injured ankle 2 weeks ago while playing roller derby, has been sore but still able to walk.  Today while trying to catch the bus rolled ankle again with increased pain and now limping a bit.  No numbness/tingling.  No prior ankle injuries or surgeries.  No intervention tried PTA.  Home Medications Prior to Admission medications   Not on File      Allergies    Patient has no known allergies.    Review of Systems   Review of Systems  Musculoskeletal:  Positive for arthralgias.  All other systems reviewed and are negative.   Physical Exam Updated Vital Signs BP 130/77   Pulse 73   Temp 98.6 F (37 C)   Resp 18   Ht 6' (1.829 m)   Wt 102.1 kg   SpO2 97%   BMI 30.53 kg/m   Physical Exam Vitals and nursing note reviewed.  Constitutional:      Appearance: She is well-developed.  HENT:     Head: Normocephalic and atraumatic.  Eyes:     Conjunctiva/sclera: Conjunctivae normal.     Pupils: Pupils are equal, round, and reactive to light.  Cardiovascular:     Rate and Rhythm: Normal rate and regular rhythm.     Heart sounds: Normal heart sounds.  Pulmonary:     Effort: Pulmonary effort is normal.     Breath sounds: Normal breath sounds.  Abdominal:     General: Bowel sounds are normal.     Palpations: Abdomen is soft.  Musculoskeletal:        General: Normal range of motion.     Cervical back: Normal range of motion.     Comments: Right ankle with swelling surrounding lateral malleolus, there is some mild bruising just anterior to this, no bony deformities, DP pulse  intact, wiggles toes on command, normal sensation distally  Skin:    General: Skin is warm and dry.  Neurological:     Mental Status: She is alert and oriented to person, place, and time.     ED Results / Procedures / Treatments   Labs (all labs ordered are listed, but only abnormal results are displayed) Labs Reviewed - No data to display  EKG None  Radiology DG Ankle Complete Right  Result Date: 08/14/2023 CLINICAL DATA:  Status post trauma 2 weeks ago with subsequent right ankle pain. EXAM: RIGHT ANKLE - COMPLETE 3+ VIEW COMPARISON:  None Available. FINDINGS: There is no evidence of fracture, dislocation, or joint effusion. There is no evidence of arthropathy or other focal bone abnormality. Mild anterior and lateral soft tissue swelling is noted. IMPRESSION: Mild anterior and lateral soft tissue swelling without evidence of acute fracture or dislocation. Electronically Signed   By: Aram Candela M.D.   On: 08/14/2023 00:16    Procedures Procedures    Medications Ordered in ED Medications - No data to display  ED Course/ Medical Decision Making/ A&P  Medical Decision Making Amount and/or Complexity of Data Reviewed Radiology: ordered and independent interpretation performed.   37 year old presenting to the ED with right ankle injury.  Has twisted this twice in the past 2 weeks.  Does have some swelling along the lateral malleolus but no acute deformity.  Foot is neurovascularly intact.  X-rays negative for any acute bony findings.  Placed in ASO and given crutches, progress back to full weightbearing as tolerated.  Orthopedic follow-up given if needed.  Return here for new concerns.  Final Clinical Impression(s) / ED Diagnoses Final diagnoses:  Sprain of right ankle, unspecified ligament, initial encounter    Rx / DC Orders ED Discharge Orders     None         Garlon Hatchet, PA-C 08/14/23 0133    Sabas Sous,  MD 08/14/23 [phone]

## 2023-08-14 NOTE — ED Notes (Signed)
Pt provided with ASO brace and crutches. ASO braced fitted to pts R ankle. Pt verbalized understanding of use. Pt provided with crutches and instructed on use. Pt demonstrated proper use after teaching.
